# Patient Record
Sex: Male | Born: 1945 | Race: White | Hispanic: No | Marital: Single | Smoking: Never smoker
Health system: Southern US, Community
[De-identification: ages and names within clinical notes are randomized; demographics above are authoritative.]

## PROBLEM LIST (undated history)

## (undated) DIAGNOSIS — M109 Gout, unspecified: Secondary | ICD-10-CM

## (undated) DIAGNOSIS — I10 Essential (primary) hypertension: Secondary | ICD-10-CM

---

## 2011-08-02 ENCOUNTER — Encounter (HOSPITAL_COMMUNITY): Payer: Self-pay

## 2011-08-02 ENCOUNTER — Emergency Department (INDEPENDENT_AMBULATORY_CARE_PROVIDER_SITE_OTHER)
Admission: EM | Admit: 2011-08-02 | Discharge: 2011-08-02 | Disposition: A | Discharge status: Home / Self Care | Payer: Medicare Other | Source: Home / Self Care | Attending: Emergency Medicine | Admitting: Emergency Medicine

## 2011-08-02 DIAGNOSIS — T148XXA Other injury of unspecified body region, initial encounter: Secondary | ICD-10-CM

## 2011-08-02 HISTORY — DX: Essential (primary) hypertension: I10

## 2011-08-02 HISTORY — DX: Gout, unspecified: M10.9

## 2011-08-02 MED ORDER — TRAMADOL HCL 50 MG PO TABS: 100.0000 mg | ORAL_TABLET | Freq: Three times a day (TID) | ORAL | Status: AC | PRN

## 2011-08-02 MED ORDER — MELOXICAM 7.5 MG PO TABS: 7.5000 mg | ORAL_TABLET | Freq: Every day | ORAL | Status: AC

## 2011-08-02 NOTE — ED Notes (Signed)
Episodic pain in both lower legs for about 2 years; saw his MD who gave him a Rx for the problem , but he "didn't like the readout on the Rx, so I didn't take it; told my Dr I wasn't going to take it" not sure what it was , but it started w a "t"; has been waking up at night to use BR, and has pain then (thinks it's just the UA making him get up) using ASA, tylenol and OTC pain patches for the syx, but still having pain ; denies injury or changes in his skin

## 2011-08-02 NOTE — ED Provider Notes (Signed)
Chief Complaint  Patient presents with  . Leg Pain    History of Present Illness:   Harry Holmes is a 66 year old male who has had a two-day history of right posterior calf pain. He denies any injury. There's been no swelling, redness, or heat. It is somewhat worse if he walks. He denies any shortness of breath or chest pain and no prior history of DVT or blood clots. No family history of clotting disorders. No prolonged car travel, weight loss, recent surgery, prolonged immobilization, or history of cancer. He denies any numbness, or tingling. There's been no muscle weakness.  Review of Systems:  Other than noted above, the patient denies any of the following symptoms: Systemic:  No fevers, chills, sweats, or aches.  No fatigue or tiredness. Musculoskeletal:  No joint pain, arthritis, bursitis, swelling, back pain, or neck pain. Neurological:  No muscular weakness, paresthesias, headache, or trouble with speech or coordination.  No dizziness.   PMFSH:  Past medical history, family history, social history, meds, and allergies were reviewed.  Physical Exam:   Vital signs:  BP 135/85  Pulse 68  Temp(Src) 97.8 F (36.6 C) (Oral)  Resp 18  SpO2 96% Gen:  Alert and oriented times 3.  In no distress. Musculoskeletal: He had some tenderness to palpation in his lower gastrocnemius tear the Achilles tendon. There is no obvious swelling. Calf circumference was 47 cm bilaterally. There was no redness or heat. Homan sign was negative. No pain to palpation in the popliteal fossa. Joint survey is otherwise unremarkable. Muscle strength is normal, sensation is normal, and pulses were full. Otherwise, all joints had a full a ROM with no swelling, bruising or deformity.  No edema, pulses full. Extremities were warm and pink.  Capillary refill was brisk.  Skin:  Clear, warm and dry.  No rash. Neuro:  Alert and oriented times 3.  Muscle strength was normal.  Sensation was intact to light touch.   Results for orders  placed during the hospital encounter of 08/02/11  D-DIMER, QUANTITATIVE      Component Value Range   D-Dimer, Quant 0.32  0.00 - 0.48 (ug/mL-FEU)    Assessment:  The encounter diagnosis was Muscle strain.  Plan:   1.  The following meds were prescribed:   New Prescriptions   MELOXICAM (MOBIC) 7.5 MG TABLET    Take 1 tablet (7.5 mg total) by mouth daily.   TRAMADOL (ULTRAM) 50 MG TABLET    Take 2 tablets (100 mg total) by mouth every 8 (eight) hours as needed for pain.   2.  The patient was instructed in symptomatic care, including rest and activity, elevation, application of ice and compression.  Appropriate handouts were given. 3.  The patient was told to return if becoming worse in any way, if no better in 3 or 4 days, and given some red flag symptoms that would indicate earlier return.   4.  The patient was told to follow up either here or with his primary care physician if no better in 3 or 4 days.   Reuben Likes, MD 08/02/11 [phone]

## 2011-08-02 NOTE — Discharge Instructions (Signed)
Muscle Strain A muscle strain (pulled muscle) happens when a muscle is over-stretched. Recovery usually takes 5 to 6 weeks.  HOME CARE   Put ice on the injured area.   Put ice in a plastic bag.   Place a towel between your skin and the bag.   Leave the ice on for 15 to 20 minutes at a time, every hour for the first 2 days.   Do not use the muscle for several days or until your doctor says you can. Do not use the muscle if you have pain.   Wrap the injured area with an elastic bandage for comfort. Do not put it on too tightly.   Only take medicine as told by your doctor.   Warm up before exercise. This helps prevent muscle strains.  GET HELP RIGHT AWAY IF:  There is increased pain or puffiness (swelling) in the affected area. MAKE SURE YOU:   Understand these instructions.   Will watch your condition.   Will get help right away if you are not doing well or get worse.  Document Released: 12/23/2007 Document Revised: 03/04/2011 Document Reviewed: 12/23/2007 [employer] Patient Information 2012 [employer]. 

## 2011-08-26 ENCOUNTER — Other Ambulatory Visit: Payer: Self-pay | Admitting: Nephrology

## 2012-01-17 ENCOUNTER — Ambulatory Visit
Admission: RE | Admit: 2012-01-17 | Discharge: 2012-01-17 | Disposition: A | Discharge status: Home / Self Care | Payer: Medicare Other | Source: Ambulatory Visit | Attending: Nephrology | Admitting: Nephrology

## 2012-01-17 ENCOUNTER — Other Ambulatory Visit: Payer: Self-pay | Admitting: Nephrology

## 2012-01-17 DIAGNOSIS — Z87891 Personal history of nicotine dependence: Secondary | ICD-10-CM

## 2013-10-06 ENCOUNTER — Emergency Department (INDEPENDENT_AMBULATORY_CARE_PROVIDER_SITE_OTHER)
Admission: EM | Admit: 2013-10-06 | Discharge: 2013-10-06 | Disposition: A | Discharge status: Nursing Home (Includes ICF) | Payer: Medicare Other | Source: Home / Self Care | Attending: Emergency Medicine | Admitting: Emergency Medicine

## 2013-10-06 ENCOUNTER — Emergency Department (HOSPITAL_COMMUNITY): Payer: Medicare Other

## 2013-10-06 ENCOUNTER — Encounter (HOSPITAL_COMMUNITY): Payer: Self-pay | Admitting: Emergency Medicine

## 2013-10-06 ENCOUNTER — Emergency Department (HOSPITAL_COMMUNITY)
Admission: EM | Admit: 2013-10-06 | Discharge: 2013-10-06 | Disposition: A | Discharge status: Home / Self Care | Payer: Medicare Other | Attending: Emergency Medicine | Admitting: Emergency Medicine

## 2013-10-06 DIAGNOSIS — W19XXXA Unspecified fall, initial encounter: Secondary | ICD-10-CM

## 2013-10-06 DIAGNOSIS — IMO0002 Reserved for concepts with insufficient information to code with codable children: Secondary | ICD-10-CM | POA: Insufficient documentation

## 2013-10-06 DIAGNOSIS — Z7982 Long term (current) use of aspirin: Secondary | ICD-10-CM | POA: Insufficient documentation

## 2013-10-06 DIAGNOSIS — Z79899 Other long term (current) drug therapy: Secondary | ICD-10-CM | POA: Diagnosis not present

## 2013-10-06 DIAGNOSIS — R071 Chest pain on breathing: Secondary | ICD-10-CM | POA: Diagnosis not present

## 2013-10-06 DIAGNOSIS — M7989 Other specified soft tissue disorders: Secondary | ICD-10-CM | POA: Diagnosis present

## 2013-10-06 DIAGNOSIS — G8911 Acute pain due to trauma: Secondary | ICD-10-CM | POA: Diagnosis not present

## 2013-10-06 DIAGNOSIS — T1490XA Injury, unspecified, initial encounter: Secondary | ICD-10-CM

## 2013-10-06 DIAGNOSIS — I10 Essential (primary) hypertension: Secondary | ICD-10-CM | POA: Diagnosis not present

## 2013-10-06 DIAGNOSIS — S20219A Contusion of unspecified front wall of thorax, initial encounter: Secondary | ICD-10-CM

## 2013-10-06 DIAGNOSIS — S20212A Contusion of left front wall of thorax, initial encounter: Secondary | ICD-10-CM

## 2013-10-06 DIAGNOSIS — R609 Edema, unspecified: Secondary | ICD-10-CM | POA: Diagnosis not present

## 2013-10-06 DIAGNOSIS — R0781 Pleurodynia: Secondary | ICD-10-CM

## 2013-10-06 DIAGNOSIS — M109 Gout, unspecified: Secondary | ICD-10-CM | POA: Insufficient documentation

## 2013-10-06 DIAGNOSIS — Y92009 Unspecified place in unspecified non-institutional (private) residence as the place of occurrence of the external cause: Secondary | ICD-10-CM

## 2013-10-06 LAB — BASIC METABOLIC PANEL
ANION GAP: 16 — AB (ref 5–15)
BUN: 13 mg/dL (ref 6–23)
CHLORIDE: 102 meq/L (ref 96–112)
CO2: 26 meq/L (ref 19–32)
CREATININE: 1.06 mg/dL (ref 0.50–1.35)
Calcium: 9.3 mg/dL (ref 8.4–10.5)
GFR calc non Af Amer: 70 mL/min — ABNORMAL LOW (ref >90)
GFR, EST AFRICAN AMERICAN: 81 mL/min — AB (ref >90)
Glucose, Bld: 125 mg/dL — ABNORMAL HIGH (ref 70–99)
POTASSIUM: 3.5 meq/L — AB (ref 3.7–5.3)
SODIUM: 144 meq/L (ref 137–147)

## 2013-10-06 LAB — CBC
HCT: 38.5 % — ABNORMAL LOW (ref 39.0–52.0)
Hemoglobin: 13.3 g/dL (ref 13.0–17.0)
MCH: 29.8 pg (ref 26.0–34.0)
MCHC: 34.5 g/dL (ref 30.0–36.0)
MCV: 86.1 fL (ref 78.0–100.0)
PLATELETS: 185 10*3/uL (ref 150–400)
RBC: 4.47 MIL/uL (ref 4.22–5.81)
RDW: 14.6 % (ref 11.5–15.5)
WBC: 6.2 10*3/uL (ref 4.0–10.5)

## 2013-10-06 LAB — URINALYSIS, ROUTINE W REFLEX MICROSCOPIC
BILIRUBIN URINE: NEGATIVE
Glucose, UA: NEGATIVE mg/dL
Hgb urine dipstick: NEGATIVE
KETONES UR: NEGATIVE mg/dL
LEUKOCYTES UA: NEGATIVE
NITRITE: NEGATIVE
PROTEIN: NEGATIVE mg/dL
Specific Gravity, Urine: 1.01 (ref 1.005–1.030)
UROBILINOGEN UA: 0.2 mg/dL (ref 0.0–1.0)
pH: 7.5 (ref 5.0–8.0)

## 2013-10-06 LAB — PRO B NATRIURETIC PEPTIDE: PRO B NATRI PEPTIDE: 14.3 pg/mL (ref 0–125)

## 2013-10-06 LAB — I-STAT TROPONIN, ED: TROPONIN I, POC: 0 ng/mL (ref 0.00–0.08)

## 2013-10-06 MED ORDER — OXYCODONE-ACETAMINOPHEN 5-325 MG PO TABS
2.0000 | ORAL_TABLET | Freq: Once | ORAL | Status: AC
  Administered 2013-10-06: 2 via ORAL
  Filled 2013-10-06: qty 2

## 2013-10-06 MED ORDER — IOHEXOL 350 MG/ML SOLN
100.0000 mL | Freq: Once | INTRAVENOUS | Status: AC | PRN
  Administered 2013-10-06: 100 mL via INTRAVENOUS

## 2013-10-06 MED ORDER — HYDROCODONE-ACETAMINOPHEN 5-325 MG PO TABS: 1.0000 | ORAL_TABLET | Freq: Four times a day (QID) | ORAL | Status: DC | PRN

## 2013-10-06 NOTE — ED Notes (Signed)
Respiratory to come give instructions on Incentive Spirometry to take home

## 2013-10-06 NOTE — ED Notes (Signed)
[facility] following a fall on Tuesday night. Reports falling off porch and landing on left side, having left rib pain since then. But went to [facility] today due to bilateral lower leg swelling since Wednesday. Did not injure legs when he fell, denies redness or warmth to legs. Reports mild sob with exertion.

## 2013-10-06 NOTE — ED Provider Notes (Signed)
Chief Complaint   Chief Complaint  Patient presents with  . Chest Pain    History of Present Illness    Sanjuana LettersLarry E Baxley is a pleasant 68 year old gentleman who relates a history of having fallen on his deck at home this past Tuesday, 5 days ago. He landed on his left lateral EKG area. He did not hit his head there was no loss of consciousness and no other obvious injury. Ever since then he's had pain in his left lateral rib cage area which is worse with movement and deep inspiration. He denies any shortness of breath, cough, or hemoptysis. Ever since the fall he's also had progressive swelling of both of his legs to the point where now they are swollen as far as the knees. They feel tight and uncomfortable, but there is no pain in the legs. He denies any syncope or cardiac history.  Review of Systems    Other than noted above, the patient denies any of the following symptoms. Systemic:  No fever or chills. Pulmonary:  No cough, wheezing, shortness of breath, sputum production, hemoptysis. Cardiac:  No palpitations, rapid heartbeat, dizziness, presyncope or syncope. GI:  No abdominal pain, heartburn, nausea, or vomiting. Ext:  No leg pain or swelling.  PMFSH    Past medical history, family history, social history, meds, and allergies were reviewed. He has hypertension and gout. He currently takes Cardura, hydrochlorothiazide, allopurinol. There is no history of DVT, pulmonary embolism, blood clots, or congestive heart failure.  Physical Exam     Vital signs:  BP 162/85  Pulse 96  Temp(Src) 98.4 F (36.9 C) (Oral)  Resp 18  SpO2 95% Gen:  Alert, oriented, in no distress, skin warm and dry. Eye:  PERRL, lids and conjunctivas normal.  Sclera non-icteric. ENT:  Mucous membranes moist, pharynx clear. Neck:  Supple, no adenopathy or tenderness.  No JVD. Lungs:  He has fine crackles at both bases. Heart:  Regular rhythm.  No gallops, murmers, clicks or rubs. Chest:  He has localized  chest wall tenderness to palpation over the left lateral chest area, no swelling, bruising, or deformity. Abdomen:  Soft, nontender, no organomegaly or mass.  Bowel sounds normal.  No pulsatile abdominal mass or bruit. Ext:  He has pitting edema extending from the feet up to the knees. There is no calf tenderness and Homans sign was negative. Pedal pulses were full. Skin:  Warm and dry.  No rash.                                                                                                                            Assessment     The primary encounter diagnosis was Chest wall contusion, left, initial encounter. Diagnoses of Fall with injury, Place of occurrence, home, and Edema were also pertinent to this visit.  After a fall if it is I would expect him to have chest wall pain, possibly due to fractured ribs. I would not expect  is that he would have generalized and progressive swelling of the legs. This makes me think of congestive heart failure, DVT, pulmonary embolism. I think he needs further evaluation in the emergency department at the hospital.  Plan     The patient was transferred to the ED via shuttle in stable condition.  Medical Decision Making:  68  Year old male with gout and hypertension fell 5 days ago on his porch landing on his left ribcage.  As expected, he has had left ribcage pain since then, but he also has had progressive swelling of both legs to knees and a tight feeling in calves.  On exam he has pitting edema to knees, intact pedal pulses, and rales at bases.  His left lateral ribs are tender to touch.  What concerns me most is the leg swelling--DVT, PE, and CHF all come to mind.  He needs further ED workup.       Reuben Likes, MD 10/06/13 [phone]

## 2013-10-06 NOTE — ED Notes (Signed)
Patient transported to CT 

## 2013-10-06 NOTE — ED Notes (Signed)
Patient states was taking out the garbage Tuesday night And slipped on some wet garbage causing him to fall and hitting The left side of his rib cage.  Having some SOB and pain that starts near His ribs and radiates to his back Since the fall he has noticed some swelling to bilateral legs and feet and Not sure if it is related to the fall

## 2013-10-06 NOTE — Discharge Instructions (Signed)
We have determined that your problem requires further evaluation in the emergency department.  We will take care of your transport there.  Once at the emergency department, you will be evaluated by a provider and they will order whatever treatment or tests they deem necessary.  We cannot guarantee that they will do any specific test or do any specific treatment.  ° °

## 2013-10-06 NOTE — ED Notes (Signed)
Pt ambulated in hallway; Sats maintained at 96-94%; denies SOB; pt ambulates well independently; Gait steady

## 2013-10-06 NOTE — ED Notes (Signed)
While ambulating pt in the hallway oxygen levels dropped to 88%.

## 2013-10-06 NOTE — ED Provider Notes (Signed)
CSN: [id number]     Arrival date & time 10/06/13  1348 History   First MD Initiated Contact with Patient 10/06/13 1516     Chief Complaint  Patient presents with  . Pain  . Leg Swelling     (Consider location/radiation/quality/duration/timing/severity/associated sxs/prior Treatment) HPI  Harry Holmes is a 68 year old male sent from urgent care for chest wall pain, shortness of breath, and leg swelling. Patient fell 5 days ago onto his left side while doing his garbage away. He had immediate severe pain in the left lateral rib cage. Pain is worsened with deep inhalation and movement of the arm. X-ray done at triage shows a subtle nondisplaced left posterior lateral ninth rib fracture. The patient states that his pain is improving. He denies any new onset shortness of breath. Patient states that he is easily able to complete his activities of daily living. He's been taking aspirin and Tylenol with relief of his symptoms. He has a history of bilateral lower extremity edema. He states that his swelling seems worse since the injury. Patient states that it's usually worse at night after being on his feet all day. Patient states that he had great difficulty putting on his shoes this morning. He has no past history of congestive heart failure. He denies any numbness or tingling in the legs. He denies any heat or swelling worse in one leg than the other. Patient denies hemoptysis. Denies DOE, orthopnea, PND. Denies fevers, chills, myalgias, arthralgias. Denies SOB, chest tightness or pressure, radiation to left arm, jaw or back, or diaphoresis. Denies dysuria, flank pain, suprapubic pain, frequency, urgency, or hematuria. Denies headaches, light headedness, weakness, visual disturbances. Denies abdominal pain, nausea, vomiting, diarrhea or constipation.   Past Medical History  Diagnosis Date  . Hypertension   . Gout    History reviewed. No pertinent past surgical history. History reviewed. No  pertinent family history. History  Substance Use Topics  . Smoking status: Passive Smoke Exposure - Never Smoker  . Smokeless tobacco: Not on file  . Alcohol Use: No    Review of Systems  Ten systems reviewed and are negative for acute change, except as noted in the HPI.    Allergies  Review of patient's allergies indicates no known allergies.  Home Medications   Prior to Admission medications   Medication Sig Start Date End Date Taking? Authorizing Provider  allopurinol (ZYLOPRIM) 100 MG tablet Take 150 mg by mouth daily.   Yes Historical Provider, MD  aspirin 81 MG tablet Take 81 mg by mouth daily.   Yes Historical Provider, MD  Coenzyme Q10 (COQ10 PO) Take 1-2 tablets by mouth daily.   Yes Historical Provider, MD  doxazosin (CARDURA) 4 MG tablet Take 4 mg by mouth at bedtime.   Yes Historical Provider, MD  GARLIC PO Take 1 tablet by mouth at bedtime.   Yes Historical Provider, MD  hydrocortisone 2.5 % cream Apply 1 application topically 2 (two) times daily. 09/06/13 09/06/14 Yes Historical Provider, MD  Multiple Vitamins-Minerals (MULTIVITAMIN PO) Take 1 tablet by mouth once a week.   Yes Historical Provider, MD  NIACIN PO Take 1 tablet by mouth daily.   Yes Historical Provider, MD  Omega-3 Fatty Acids (FISH OIL) 1200 MG CAPS Take 1,200 mg by mouth 2 (two) times daily.   Yes Historical Provider, MD  Tetrahydrozoline HCl (VISINE OP) Place 1 drop into both eyes daily.   Yes Historical Provider, MD  triamterene-hydrochlorothiazide (MAXZIDE-25) 37.5-25 MG per tablet Take 1 tablet by  mouth daily. 09/06/13  Yes Historical Provider, MD   BP 169/73  Pulse 89  Temp(Src) 98.8 F (37.1 C) (Oral)  Resp 20  Ht 5\' 5"  (1.651 m)  Wt 245 lb (111.131 kg)  BMI 40.77 kg/m2  SpO2 93% Physical Exam  Nursing note and vitals reviewed. Constitutional: He appears well-developed and well-nourished. No distress.  HENT:  Head: Normocephalic and atraumatic.  Eyes: Conjunctivae are normal. No scleral  icterus.  Neck: Normal range of motion. Neck supple.  Cardiovascular: Normal rate, regular rhythm and normal heart sounds.   Pulmonary/Chest: Effort normal and breath sounds normal. No respiratory distress.   He exhibits bony tenderness and swelling. He exhibits no tenderness, no laceration, no crepitus and no deformity.  Abdominal: Soft. There is no tenderness.  Musculoskeletal: He exhibits no edema.  Neurological: He is alert.  Skin: Skin is warm and dry. He is not diaphoretic.  Psychiatric: His behavior is normal.    ED Course  Procedures (including critical care time) Labs Review Labs Reviewed  CBC - Abnormal; Notable for the following:    HCT 38.5 (*)    All other components within normal limits  BASIC METABOLIC PANEL - Abnormal; Notable for the following:    Potassium 3.5 (*)    Glucose, Bld 125 (*)    GFR calc non Af Amer 70 (*)    GFR calc Af Amer 81 (*)    Anion gap 16 (*)    All other components within normal limits  PRO B NATRIURETIC PEPTIDE  I-STAT TROPOININ, ED    Imaging Review Dg Ribs Unilateral W/chest Left  10/06/2013   CLINICAL DATA:  Rib pain.  EXAM: LEFT RIBS AND CHEST - 3+ VIEW  COMPARISON:  None.  FINDINGS: A subtle nondisplaced left posterior lateral ninth rib fracture is present. No pneumothorax. Cardiomegaly is present. Bibasilar atelectasis and/or infiltrates/edema noted. Small pleural effusions noted. Left nephrolithiasis.  IMPRESSION: 1. Subtle nondisplaced left posterior lateral ninth rib fracture. No pneumothorax.  2. Cardiomegaly with mild bibasilar atelectasis and/or infiltrates/edema. Small pleural effusion.  3. Left nephrolithiasis   Electronically Signed   By: Maisie Fushomas  Register   On: 10/06/2013 15:00     EKG Interpretation   Date/Time:  Saturday October 06 2013 14:03:52 EDT Ventricular Rate:  95 PR Interval:  170 QRS Duration: 82 QT Interval:  258 QTC Calculation: 324 R Axis:   120 Text Interpretation:  Sinus rhythm with Premature atrial  complexes in a  pattern of bigeminy Left posterior fascicular block Possible Inferior  infarct , age undetermined Anterior infarct , age undetermined Abnormal  ECG No previous ECGs available Confirmed by Manus GunningANCOUR  MD, STEPHEN [phone]([id number])  on 10/06/2013 4:11:05 PM      MDM   Final diagnoses:  Rib pain on right side  Peripheral edema   4:21 PM BP 169/73  Pulse 89  Temp(Src) 98.8 F (37.1 C) (Oral)  Resp 20  Ht 5\' 5"  (1.651 m)  Wt 245 lb (111.131 kg)  BMI 40.77 kg/m2  SpO2 93% Patient EKG does not show signs of acute ischemia. Patient has mild hyperglycemia, negative troponin, CBC, Pro BNP    Patient ambulated in the hallway with desaturations to 80% on room air. Head concern for new onset heart failure as etiology of his leg swelling. His pro BNP is pending. He is a slight anion gap with an elevated blood glucose today. X-ray shows cardiomegaly pleural effusions bilaterally and 9th rib fracture.    Patient with negative CT angio. No rib  fracture on Ct.  Patient chest pain improved. Does not appear to have any CHF either. Pateint able to ambulate in ED with pulse ox and 02 > 90 % on RA,   Procedure Note:  Definitive Fracture Care:  Definitive fracture care performred for the (rib,clavicle,nasal).  This included analgesia in the ED, incentive spiromotery,  and prescriptions for outpatient pain control which have been provided.  I have counseled the pt on possible complications of the fractures and signs and symptoms which would mandate return for further care as well as the utility of RICE therapy. The patient has expressed their understanding.     Arthor Captain, PA-C 10/12/13 [phone]

## 2013-10-06 NOTE — Discharge Instructions (Signed)
Your caregiver has diagnosed you as having chest wall pain. This means that after looking at you and examining you and ordering tests (such as blood work, chest x-rays and EKG), your caregiver does not believe that the problem is serious enough to need watching in the hospital. This judgment is often made after testing shows no acute heart attack and you are at low risk for sudden acute heart condition.  Seek immediate medical attention if:   You have severe chest pain, especially if the pain is crushing or pressure-like and spreads to the arms, back, neck, or jaw, or if you have sweating, nausea, shortness of breath. This is an emergency. Don't wait to see if the pain will go away. Get medical help at once. Call 911 immediately. Do not drive herself to the hospital.   Your chest pain gets worse and does not go away with rest.   You have an attack of chest pain lasting longer than usual, despite rest and treatment with the medications your caregiver has prescribed   You awaken from sleep with chest pain or shortness of breath.   You feel faint or dizzy   You have chest pain not typical of your usual pain for which you originally saw your caregiver.  You must have a repeat evaluation within 24 hours for a recheck of your heart.  Please call your doctor this morning to schedule this appointment. If you do not have a family doctor, please see the list of doctors below.  Rib Fracture A rib fracture is a break or crack in one of the bones of the ribs. The ribs are a group of long, curved bones that wrap around your chest and attach to your spine. They protect your lungs and other organs in the chest cavity. A broken or cracked rib is often painful, but most do not cause other problems. Most rib fractures heal on their own over time. However, rib fractures can be more serious if multiple ribs are broken or if broken ribs move out of place and push against other structures. CAUSES   A direct  blow to the chest. For example, this could happen during contact sports, a car accident, or a fall against a hard object.  Repetitive movements with high force, such as pitching a baseball or having severe coughing spells. SYMPTOMS   Pain when you breathe in or cough.  Pain when someone presses on the injured area. DIAGNOSIS  Your caregiver will perform a physical exam. Various imaging tests may be ordered to confirm the diagnosis and to look for related injuries. These tests may include a chest X-ray, computed tomography (CT), magnetic resonance imaging (MRI), or a bone scan. TREATMENT  Rib fractures usually heal on their own in 1-3 months. The longer healing period is often associated with a continued cough or other aggravating activities. During the healing period, pain control is very important. Medication is usually given to control pain. Hospitalization or surgery may be needed for more severe injuries, such as those in which multiple ribs are broken or the ribs have moved out of place.  HOME CARE INSTRUCTIONS   Avoid strenuous activity and any activities or movements that cause pain. Be careful during activities and avoid bumping the injured rib.  Gradually increase activity as directed by your caregiver.  Only take over-the-counter or prescription medications as directed by your caregiver. Do not take other medications without asking your caregiver first.  Apply ice to the injured area for the  first 1-2 days after you have been treated or as directed by your caregiver. Applying ice helps to reduce inflammation and pain.  Put ice in a plastic bag.  Place a towel between your skin and the bag.   Leave the ice on for 15-20 minutes at a time, every 2 hours while you are awake.  Perform deep breathing as directed by your caregiver. This will help prevent pneumonia, which is a common complication of a broken rib. Your caregiver may instruct you to:  Take deep breaths several times a  day.  Try to cough several times a day, holding a pillow against the injured area.  Use a device called an incentive spirometer to practice deep breathing several times a day.  Drink enough fluids to keep your urine clear or pale yellow. This will help you avoid constipation.   Do not wear a rib belt or binder. These restrict breathing, which can lead to pneumonia.  SEEK IMMEDIATE MEDICAL CARE IF:   You have a fever.   You have difficulty breathing or shortness of breath.   You develop a continual cough, or you cough up thick or bloody sputum.  You feel sick to your stomach (nausea), throw up (vomit), or have abdominal pain.   You have worsening pain not controlled with medications.  MAKE SURE YOU:  Understand these instructions.  Will watch your condition.  Will get help right away if you are not doing well or get worse. Document Released: 03/15/2005 Document Revised: 11/15/2012 Document Reviewed: 05/17/2012 [facility] Patient Information 2015 [place], [place]. This information is not intended to replace advice given to you by your health care provider. Make sure you discuss any questions you have with your health care provider.  Peripheral Edema You have swelling in your legs (peripheral edema). This swelling is due to excess accumulation of salt and water in your body. Edema may be a sign of heart, kidney or liver disease, or a side effect of a medication. It may also be due to problems in the leg veins. Elevating your legs and using special support stockings may be very helpful, if the cause of the swelling is due to poor venous circulation. Avoid long periods of standing, whatever the cause. Treatment of edema depends on identifying the cause. Chips, pretzels, pickles and other salty foods should be avoided. Restricting salt in your diet is almost always needed. Water pills (diuretics) are often used to remove the excess salt and water from your body via urine. These medicines  prevent the kidney from reabsorbing sodium. This increases urine flow. Diuretic treatment may also result in lowering of potassium levels in your body. Potassium supplements may be needed if you have to use diuretics daily. Daily weights can help you keep track of your progress in clearing your edema. You should call your caregiver for follow up care as recommended. SEEK IMMEDIATE MEDICAL CARE IF:   You have increased swelling, pain, redness, or heat in your legs.  You develop shortness of breath, especially when lying down.  You develop chest or abdominal pain, weakness, or fainting.  You have a fever. Document Released: 04/22/2004 Document Revised: 06/07/2011 Document Reviewed: 04/02/2009 [facility] Patient Information 2015 [place], [place]. This information is not intended to replace advice given to you by your health care provider. Make sure you discuss any questions you have with your health care provider.  RESOURCE GUIDE  Dental Problems  Patients with Medicaid: [facility]  [facility] [address]                                           [phone] Phone:  [phone]                                                  Phone:  [phone]  If unable to pay or uninsured, contact:  [facility] or [facility] to become qualified for the adult dental clinic.  Chronic Pain Problems Contact [facility]  [phone] Patients need to be referred by their primary care doctor.  Insufficient Money for Medicine Contact United Way:  call "211" or [facility] [phone].  No Primary Care Doctor Call [facility]  [phone] Other agencies that provide inexpensive medical care    [facility]  [phone]    [facility]  [phone]    [facility]  [phone]    [facility]  [phone]    [facility]  [phone]    [facility]   [phone]  Psychological Services [facility]  [phone] [facility]  [phone] [facility]   [phone] (emergency services [phone])  Substance Abuse Resources [facility]  [phone] [facility] [phone] [facility] [phone] [facility] [phone] Residential & Outpatient Substance Abuse Program  [phone]  Abuse/Neglect [facility] Hotline [phone](336) [phone] [facility] Hotline [phone] (After Hours)  Emergency Shelter [facility] [phone]  M[facility] Homes [facility] [place] [facility] [place] [phone](336) [phone] [facility] [phone]  MRSA Hotline #:   930-329-2963(351)811-8385    [facility]  [facility]     [employer]                          [facility] [address] [place]                       [address]      [address]  [place]                                                [place]                            [place] Phone:  [phone]                                   Phone:  [phone]                 Phone:  [phone]  [facility] Phone:  [phone]  [facility] Child [facility] Hotline 805-426-3577(336) [phone] (After Hours)

## 2013-10-12 NOTE — ED Provider Notes (Signed)
Medical screening examination/treatment/procedure(s) were conducted as a shared visit with non-physician practitioner(s) and myself.  I personally evaluated the patient during the encounter.  L rib pain x 5 days after fall.  [facility] and [facility].  Hx similar. No DOE or orthopnea. TTP L posterior rib, equal breath sounds, abdomen soft.  +1 edema to ankles bilaterally.   EKG Interpretation   Date/Time:  Saturday October 06 2013 14:03:52 EDT Ventricular Rate:  95 PR Interval:  170 QRS Duration: 82 QT Interval:  258 QTC Calculation: 324 R Axis:   120 Text Interpretation:  Sinus rhythm with Premature atrial complexes in a  pattern of bigeminy Left posterior fascicular block Possible Inferior  infarct , age undetermined Anterior infarct , age undetermined Abnormal  ECG No previous ECGs available Confirmed by Manus GunningANCOUR  MD, Ivonne [place] [id number])  on 10/06/2013 4:11:05 PM       Glynn OctaveStephen Shalandra Leu, MD 10/12/13 [phone]

## 2013-11-26 ENCOUNTER — Encounter: Payer: Self-pay | Admitting: Podiatry

## 2013-11-26 ENCOUNTER — Ambulatory Visit (INDEPENDENT_AMBULATORY_CARE_PROVIDER_SITE_OTHER): Payer: Medicare Other | Admitting: Podiatry

## 2013-11-26 VITALS — BP 128/88 | HR 85 | Ht 65.0 in | Wt 249.0 lb

## 2013-11-26 DIAGNOSIS — M722 Plantar fascial fibromatosis: Secondary | ICD-10-CM

## 2013-11-26 DIAGNOSIS — M79609 Pain in unspecified limb: Secondary | ICD-10-CM

## 2013-11-26 DIAGNOSIS — M21961 Unspecified acquired deformity of right lower leg: Secondary | ICD-10-CM

## 2013-11-26 DIAGNOSIS — M21969 Unspecified acquired deformity of unspecified lower leg: Secondary | ICD-10-CM

## 2013-11-26 NOTE — Patient Instructions (Signed)
Seen for right heel pain. Injection given and Night Splint dispensed. Change shoes to lace up shoes. Return in 2 weeks.

## 2013-11-26 NOTE — Progress Notes (Signed)
SUBJECTIVE: 68 y.o. year old male presents complaining of pain in right heel especially when first get up in the morning x 2 weeks. He is retired and not on feet much. He does have weight issue and have difficulty loosing it.   REVIEW OF SYSTEMS: Constitutional: negative for chills, fevers and weight loss Eyes: negative Ears, nose, mouth, throat, and face: negative Respiratory: negative Cardiovascular: negative Gastrointestinal: negative Genitourinary:negative Musculoskeletal:Problem with lower back pain and gets adjusted by Chiropractore that helps.  Neurological: negative  OBJECTIVE: DERMATOLOGIC EXAMINATION: Nails: Positive of yellow thick nails x 10. No other skin lesions.  VASCULAR EXAMINATION OF LOWER LIMBS: Pedal pulses: All pedal pulses are palpable with normal pulsation.  Capillary Filling times within 3 seconds in all digits.  No edema or erythema noted. NEUROLOGIC EXAMINATION OF THE LOWER LIMBS: All epicritic and tactile sensations grossly intact.  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  RADIOGRAPHIC FINDINGS: Noted of long first Metatarsal bone bilateral R>L. High arched cavus type foot. Positive of mild plantar calcaneal spur bilateral. No other acute changes in osseous or articular structures.   ASSESSMENT: Plantar fasciitis right. Long first metatarsal with elevatus bilateral.  PLAN: Reviewed clinical findings and available treatment options. Right heel was injected with mixture of 4 mg Dexamethasone, 4 mg Triamcinolone, and 1 cc of 0.5% Marcaine plain. Patient tolerated well without difficulty.  Night Splint dispensed with instructions. Advised to change in shoe gear. Return in 2 weeks.

## 2013-12-10 ENCOUNTER — Ambulatory Visit (INDEPENDENT_AMBULATORY_CARE_PROVIDER_SITE_OTHER): Payer: Medicare Other | Admitting: Podiatry

## 2013-12-10 ENCOUNTER — Encounter: Payer: Self-pay | Admitting: Podiatry

## 2013-12-10 VITALS — Ht 65.0 in | Wt 249.0 lb

## 2013-12-10 DIAGNOSIS — M21969 Unspecified acquired deformity of unspecified lower leg: Secondary | ICD-10-CM

## 2013-12-10 DIAGNOSIS — M79609 Pain in unspecified limb: Secondary | ICD-10-CM

## 2013-12-10 DIAGNOSIS — M21961 Unspecified acquired deformity of right lower leg: Secondary | ICD-10-CM

## 2013-12-10 DIAGNOSIS — M722 Plantar fascial fibromatosis: Secondary | ICD-10-CM

## 2013-12-10 NOTE — Progress Notes (Signed)
Subjective: 68 year old male presents for follow up on right heel pain. Stated that he is still having pain in the morning and have soreness to walk.  Problem with Night Splint, it kept coming off.  No new changes.  OBJECTIVE:  DERMATOLOGIC EXAMINATION:  Nails: Positive of yellow thick nails x 10.  VASCULAR EXAMINATION OF LOWER LIMBS: Within normal.  No edema or erythema noted.  NEUROLOGIC EXAMINATION OF THE LOWER LIMBS:  All epicritic and tactile sensations grossly intact.  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  RADIOGRAPHIC FINDINGS: Noted of long first Metatarsal bone bilateral R>L. High arched cavus type foot.  Positive of mild plantar calcaneal spur bilateral. No other acute changes in osseous or articular structures.   ASSESSMENT:  Plantar fasciitis right, unresolved with cortisone injection and night splint.  PLAN:  Reviewed clinical findings and available treatment options.  Right heel injection repeated with mixture of 4 mg Dexamethasone, 4 mg Triamcinolone, and 1 cc of 0.5% Marcaine plain. Patient tolerated well without difficulty.  New Night Splint with posterior splint dispensed with instructions.  Return in one month.

## 2013-12-10 NOTE — Patient Instructions (Signed)
Seen for right heel pain.  Injection repeated. Night Splint replaced with rigid posterior splint. Return in one month.

## 2014-01-09 ENCOUNTER — Ambulatory Visit (INDEPENDENT_AMBULATORY_CARE_PROVIDER_SITE_OTHER): Payer: Medicare Other | Admitting: Podiatry

## 2014-01-09 ENCOUNTER — Encounter: Payer: Self-pay | Admitting: Podiatry

## 2014-01-09 DIAGNOSIS — M722 Plantar fascial fibromatosis: Secondary | ICD-10-CM

## 2014-01-09 DIAGNOSIS — M21961 Unspecified acquired deformity of right lower leg: Secondary | ICD-10-CM

## 2014-01-09 NOTE — Progress Notes (Signed)
Subjective:  68 year old male presents for follow up on right heel pain. Stated that he is still having pain in the morning and have soreness to walk.  He has not used Night Splint as he should.   OBJECTIVE:  DERMATOLOGIC EXAMINATION:  Positive of yellow thick nails x 10.  VASCULAR EXAMINATION OF LOWER LIMBS: Within normal.  No edema or erythema noted.  NEUROLOGIC EXAMINATION OF THE LOWER LIMBS:  All epicritic and tactile sensations grossly intact.  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   ASSESSMENT:  Plantar fasciitis right, unresolved with cortisone injection and night splint.   PLAN:  Reviewed clinical findings and available treatment options.  Right heel injection repeated with mixture of 4 mg Dexamethasone, 4 mg Triamcinolone, and 1 cc of 0.5% Marcaine plain. Patient tolerated well without difficulty. This is third cortisone injection. Continue to use Night Splint.  At home use Sandals and avoid barefoot.   Return in one month.

## 2014-01-09 NOTE — Patient Instructions (Signed)
Seen for pain in right heel. Cortisone injection repeated. Continue with Night Splint and use sandals at home. Return in 1 month.

## 2014-02-08 ENCOUNTER — Ambulatory Visit: Payer: Medicare Other | Admitting: Podiatry

## 2014-03-15 ENCOUNTER — Encounter: Payer: Self-pay | Admitting: Podiatry

## 2014-03-15 ENCOUNTER — Ambulatory Visit (INDEPENDENT_AMBULATORY_CARE_PROVIDER_SITE_OTHER): Payer: Medicare Other | Admitting: Podiatry

## 2014-03-15 DIAGNOSIS — M659 Synovitis and tenosynovitis, unspecified: Secondary | ICD-10-CM | POA: Insufficient documentation

## 2014-03-15 DIAGNOSIS — M6588 Other synovitis and tenosynovitis, other site: Secondary | ICD-10-CM | POA: Diagnosis not present

## 2014-03-15 DIAGNOSIS — M21961 Unspecified acquired deformity of right lower leg: Secondary | ICD-10-CM

## 2014-03-15 DIAGNOSIS — M25571 Pain in right ankle and joints of right foot: Secondary | ICD-10-CM

## 2014-03-15 DIAGNOSIS — M65979 Unspecified synovitis and tenosynovitis, unspecified ankle and foot: Secondary | ICD-10-CM

## 2014-03-15 NOTE — Patient Instructions (Signed)
Seen for right ankle pain. Reviewed findings. Ankle brace (large) dispensed. Return as needed.

## 2014-03-15 NOTE — Progress Notes (Signed)
Subjective:   68 year old male presents stating that pain is now on right ankle lateral aspect x 2-3 weeks. Heel no longer hurts.  OBJECTIVE:  DERMATOLOGIC EXAMINATION:  Positive of yellow thick nails x 10.  VASCULAR EXAMINATION OF LOWER LIMBS: Within normal.  No edema or erythema noted.  NEUROLOGIC EXAMINATION OF THE LOWER LIMBS:  All epicritic and tactile sensations grossly intact.  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   ASSESSMENT:  Plantar fasciitis right resolved with cortisone injection and night splint.  Right ankle pain/tenosynovitis.  Abnormal first ray with hyperpronation right ankle.   PLAN:  Reviewed clinical findings and available treatment options.  Ankle brace dispensed for right ankle.

## 2015-05-24 IMAGING — CR DG RIBS W/ CHEST 3+V*L*
5 series · 5 of 5 positions shown · non-contrast
Comparison: None.

CLINICAL DATA: Rib pain.

EXAM:
LEFT RIBS AND CHEST - 3+ VIEW

[w chest pa]
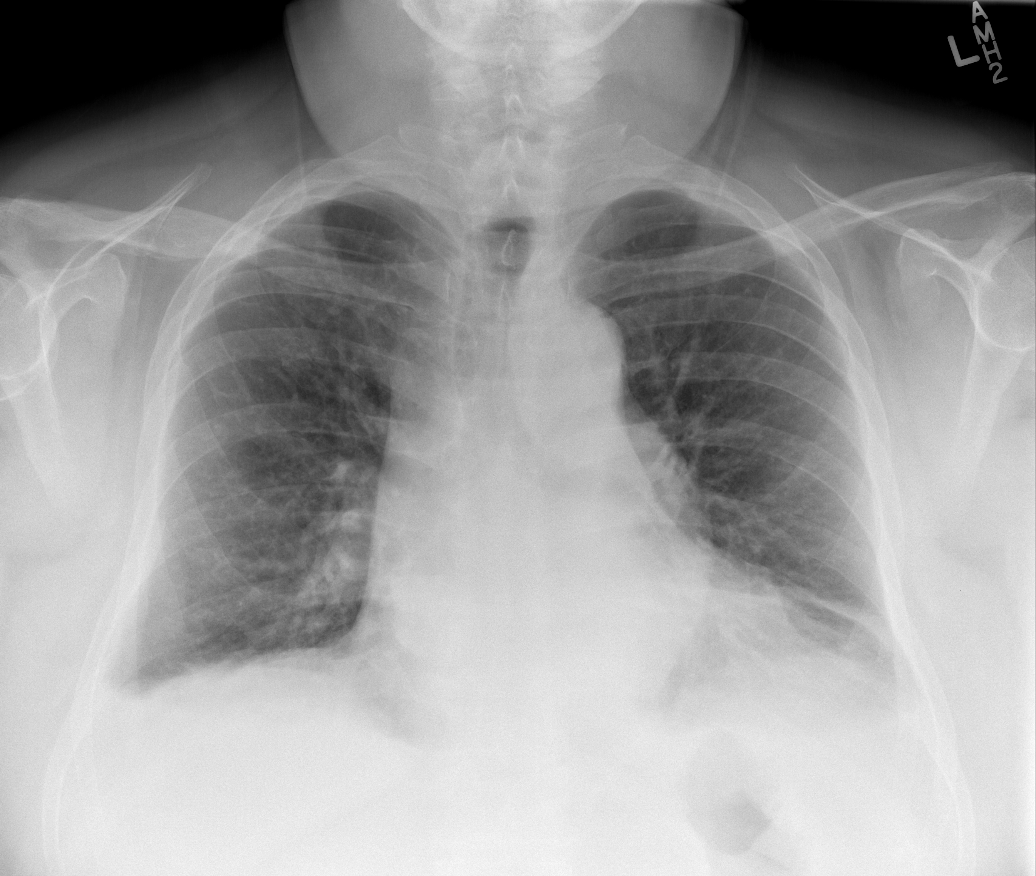

[w ribs ap/pa upper left]
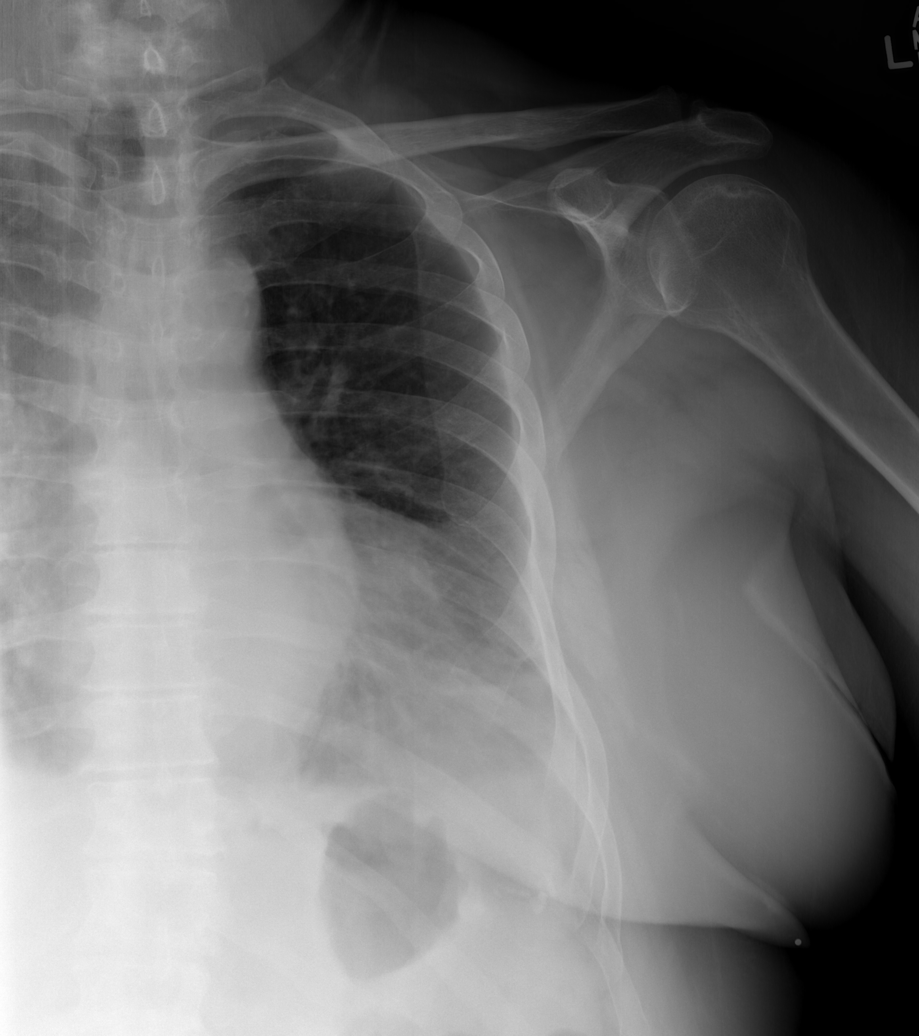

[w ribs ap/pa lower left]
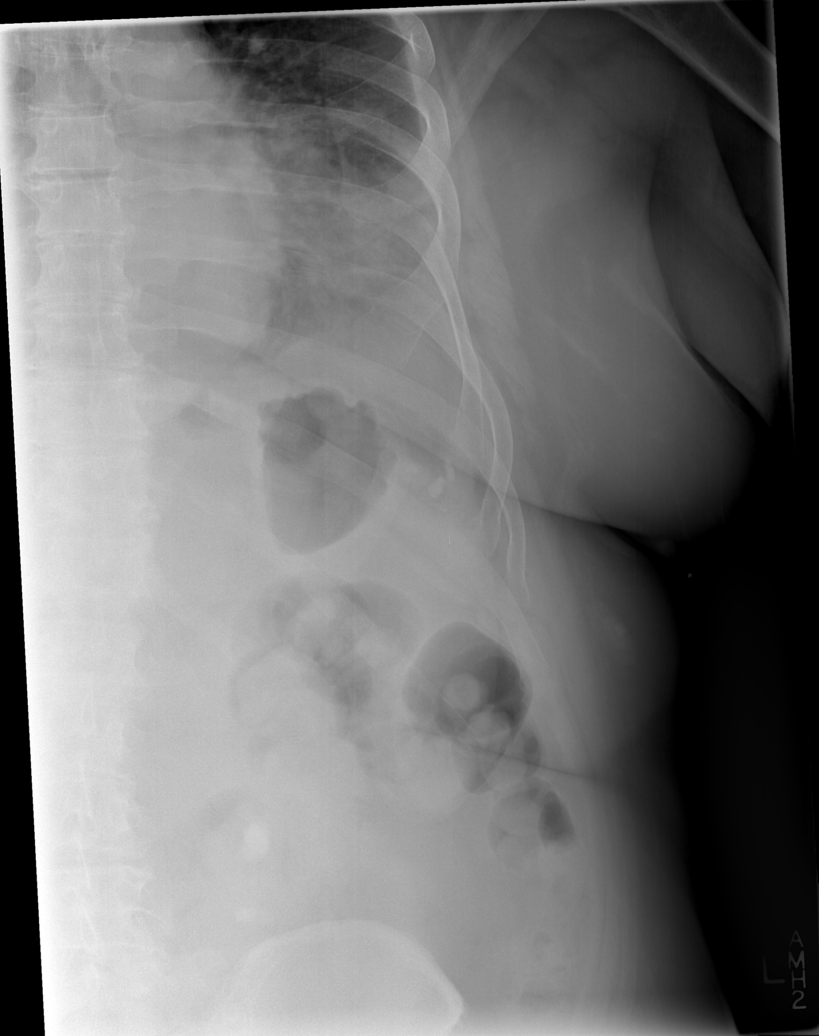

[w ribs oblique left (1 of 2)]
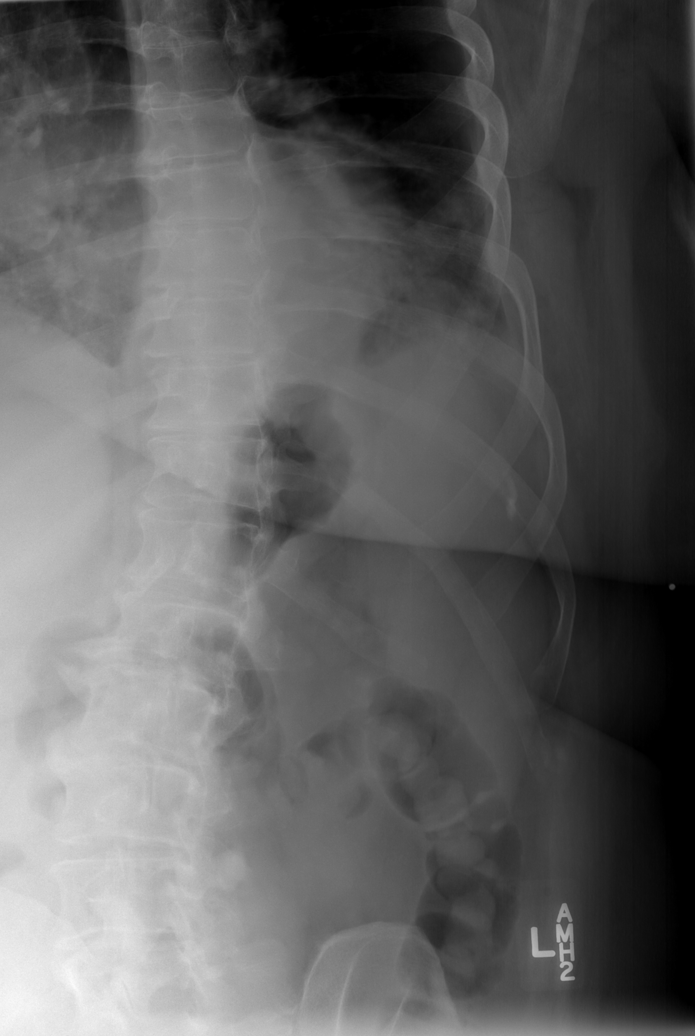

[w ribs oblique left (2 of 2)]
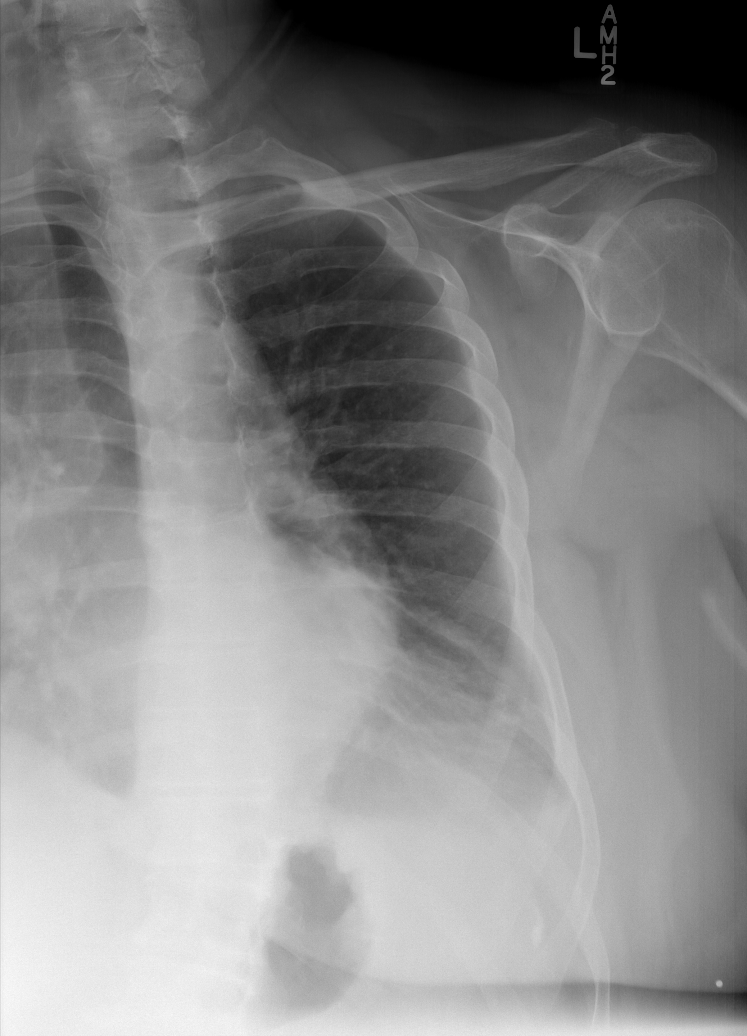

[5 of 5 positions shown; findings below may reference images not displayed]

FINDINGS: A subtle nondisplaced left posterior lateral ninth rib fracture is
present. No pneumothorax. Cardiomegaly is present. Bibasilar
atelectasis and/or infiltrates/edema noted. Small pleural effusions
noted. Left nephrolithiasis.
IMPRESSION: 1. Subtle nondisplaced left posterior lateral ninth rib fracture. No
pneumothorax.

2. Cardiomegaly with mild bibasilar atelectasis and/or
infiltrates/edema. Small pleural effusion.

3. Left nephrolithiasis

## 2015-05-25 ENCOUNTER — Other Ambulatory Visit (HOSPITAL_COMMUNITY)
Admission: RE | Admit: 2015-05-25 | Discharge: 2015-05-25 | Disposition: A | Discharge status: Home / Self Care | Payer: Medicare Other | Source: Ambulatory Visit | Attending: Emergency Medicine | Admitting: Emergency Medicine

## 2015-05-25 ENCOUNTER — Encounter (HOSPITAL_COMMUNITY): Payer: Self-pay | Admitting: *Deleted

## 2015-05-25 ENCOUNTER — Emergency Department (INDEPENDENT_AMBULATORY_CARE_PROVIDER_SITE_OTHER)
Admission: EM | Admit: 2015-05-25 | Discharge: 2015-05-25 | Disposition: A | Discharge status: Home / Self Care | Payer: Medicare Other | Source: Home / Self Care | Attending: Emergency Medicine | Admitting: Emergency Medicine

## 2015-05-25 DIAGNOSIS — L03012 Cellulitis of left finger: Secondary | ICD-10-CM | POA: Diagnosis not present

## 2015-05-25 DIAGNOSIS — IMO0001 Reserved for inherently not codable concepts without codable children: Secondary | ICD-10-CM

## 2015-05-25 MED ORDER — CEPHALEXIN 500 MG PO CAPS: 500.0000 mg | ORAL_CAPSULE | Freq: Four times a day (QID) | ORAL | Status: AC

## 2015-05-25 MED ORDER — BACITRACIN ZINC 500 UNIT/GM EX OINT
TOPICAL_OINTMENT | CUTANEOUS | Status: AC
  Filled 2015-05-25: qty 0.9

## 2015-05-25 NOTE — Discharge Instructions (Signed)
Paronychia  °Paronychia is an infection of the skin. It happens near a fingernail or toenail. It may cause pain and swelling around the nail. Usually, it is not serious and it clears up with treatment. °HOME CARE °· Soak the fingers or toes in warm water as told by your doctor. You may be told to do this for 20 minutes, 2-3 times a day. °· Keep the area dry when you are not soaking it. °· Take medicines only as told by your doctor. °· If you were given an antibiotic medicine, finish all of it even if you start to feel better. °· Keep the affected area clean. °· Do not try to drain a fluid-filled bump yourself. °· Wear rubber gloves when putting your hands in water. °· Wear gloves if your hands might touch cleaners or chemicals. °· Follow your doctor's instructions about: °¨ Wound care. °¨ Bandage (dressing) changes and removal. °GET HELP IF: °· Your symptoms get worse or do not improve. °· You have a fever or chills. °· You have redness spreading from the affected area. °· You have more fluid, blood, or pus coming from the affected area. °· Your finger or knuckle is swollen or is hard to move. °  °This information is not intended to replace advice given to you by your health care provider. Make sure you discuss any questions you have with your health care provider. °  °Document Released: 03/03/2009 Document Revised: 07/30/2014 Document Reviewed: 02/20/2014 °Elsevier Interactive Patient Education ©2016 [employer]vier Inc. ° °

## 2015-05-25 NOTE — ED Notes (Signed)
Paronychia noted to left index finger; pt reports redness, pain x 3 days; noted purulence under skin today.  Has been cleansing area and soaking in salt water.

## 2015-05-26 NOTE — ED Provider Notes (Signed)
CSN: [id number]     Arrival date & time 05/25/15  1730 History   First MD Initiated Contact with Patient 05/25/15 1904     Chief Complaint  Patient presents with  . Hand Pain   (Consider location/radiation/quality/duration/timing/severity/associated sxs/prior Treatment) HPI History obtained from patient:   LOCATION:left index finger SEVERITY:3 DURATION:several days CONTEXT:picking at the nail QUALITY: MODIFYING FACTORS:salt water soaks ASSOCIATED SYMPTOMS:noticed pus pocket today TIMING:constant OCCUPATION:  Past Medical History  Diagnosis Date  . Hypertension   . Gout    History reviewed. No pertinent past surgical history. No family history on file. Social History  Substance Use Topics  . Smoking status: Never Smoker   . Smokeless tobacco: Never Used  . Alcohol Use: No    Review of Systems Pain and infected finger Allergies  Review of patient's allergies indicates no known allergies.  Home Medications   Prior to Admission medications   Medication Sig Start Date End Date Taking? Authorizing Provider  allopurinol (ZYLOPRIM) 100 MG tablet Take 150 mg by mouth daily.   Yes Historical Provider, MD  aspirin 81 MG tablet Take 81 mg by mouth daily.   Yes Historical Provider, MD  Coenzyme Q10 (COQ10 PO) Take 1-2 tablets by mouth daily.   Yes Historical Provider, MD  doxazosin (CARDURA) 4 MG tablet Take 4 mg by mouth at bedtime.   Yes Historical Provider, MD  GARLIC PO Take 1 tablet by mouth at bedtime.   Yes Historical Provider, MD  NIACIN PO Take 1 tablet by mouth daily.   Yes Historical Provider, MD  Omega-3 Fatty Acids (FISH OIL) 1200 MG CAPS Take 1,200 mg by mouth 2 (two) times daily.   Yes Historical Provider, MD  triamterene-hydrochlorothiazide (MAXZIDE-25) 37.5-25 MG per tablet Take 1 tablet by mouth daily. 09/06/13  Yes Historical Provider, MD  cephALEXin (KEFLEX) 500 MG capsule Take 1 capsule (500 mg total) by mouth 4 (four) times daily. 05/25/15   Tharon Aquas,  PA  gabapentin (NEURONTIN) 100 MG capsule Take 100 mg by mouth.    Historical Provider, MD  Multiple Vitamins-Minerals (MULTIVITAMIN PO) Take 1 tablet by mouth once a week.    Historical Provider, MD  Tetrahydrozoline HCl (VISINE OP) Place 1 drop into both eyes daily.    Historical Provider, MD   Meds Ordered and Administered this Visit  Medications - No data to display  BP 140/93 mmHg  Pulse 63  Temp(Src) 98.6 F (37 C) (Oral)  Resp 17  SpO2 100% No data found.   Physical Exam  Constitutional: He is oriented to person, place, and time. He appears well-developed and well-nourished.  Musculoskeletal: He exhibits tenderness.       Hands: Neurological: He is alert and oriented to person, place, and time.  Skin: Skin is dry.  Psychiatric: He has a normal mood and affect. His behavior is normal.  Nursing note and vitals reviewed.   ED Course  .Marland KitchenIncision and Drainage Date/Time: 05/25/2015 6:18 PM Performed by: Tharon Aquas Authorized by: Charm Rings Consent: Verbal consent obtained. Consent given by: patient Patient identity confirmed: verbally with patient Time out: Immediately prior to procedure a "time out" was called to verify the correct patient, procedure, equipment, support staff and site/side marked as required. Type: abscess Body area: upper extremity Location details: left index finger Local anesthetic: co-phenylcaine spray Needle gauge: 18 Incision type: single straight Complexity: simple Drainage: purulent Drainage amount: scant Wound treatment: wound left open Patient tolerance: Patient tolerated the procedure well with no immediate  complications Comments: Culture obtained    (including critical care time)  Labs Review Labs Reviewed  CULTURE, ROUTINE-ABSCESS    Imaging Review No results found.   Visual Acuity Review  Right Eye Distance:   Left Eye Distance:   Bilateral Distance:    Right Eye Near:   Left Eye Near:    Bilateral Near:          MDM   1. Paronychia of second finger of left hand    Patient is reassured that there are no issues that require transfer to higher level of care at this time.  Patient is advised to continue home symptomatic treatment. Prescription is sent to  pharmacy patient has indicated.[facility] Patient is advised that if there are new or worsening symptoms or attend the emergency department, or contact primary care provider. Instructions of care provided discharged home in stable condition. Return to work/school note provided.  THIS NOTE WAS GENERATED USING A VOICE RECOGNITION SOFTWARE PROGRAM. ALL REASONABLE EFFORTS  WERE MADE TO PROOFREAD THIS DOCUMENT FOR ACCURACY.     Tharon Aquas, PA 05/26/15 [phone]

## 2015-05-28 LAB — CULTURE, ROUTINE-ABSCESS: CULTURE: NORMAL

## 2015-06-05 ENCOUNTER — Telehealth (HOSPITAL_COMMUNITY): Payer: Self-pay | Admitting: Emergency Medicine

## 2015-06-05 NOTE — ED Notes (Signed)
Called pt and notified of recent lab results from visit 2/26 Pt ID'd properly... Reports feeling better and sx have subsided  Per Dr. Dayton ScrapeMurray,  Finger abscess culture was negative for MRSA. No change in antibiotics needed, ok [facility]. Recheck or followup pcp/Michael Badger for increasing redness/swelling/pain/drainage. LM  Adv pt if sx are not getting better to return  Pt verb understanding

## 2018-12-12 ENCOUNTER — Other Ambulatory Visit: Payer: Self-pay

## 2018-12-12 DIAGNOSIS — Z20822 Contact with and (suspected) exposure to covid-19: Secondary | ICD-10-CM

## 2018-12-14 LAB — NOVEL CORONAVIRUS, NAA: SARS-CoV-2, NAA: NOT DETECTED

## 2019-02-07 ENCOUNTER — Other Ambulatory Visit: Payer: Self-pay

## 2019-02-07 DIAGNOSIS — Z20822 Contact with and (suspected) exposure to covid-19: Secondary | ICD-10-CM

## 2019-02-09 LAB — NOVEL CORONAVIRUS, NAA: SARS-CoV-2, NAA: NOT DETECTED
